# Patient Record
Sex: Female | Born: 1955 | Hispanic: No | Smoking: Never smoker
Health system: Southern US, Community
[De-identification: ages and names within clinical notes are randomized; demographics above are authoritative.]

## PROBLEM LIST (undated history)

## (undated) HISTORY — PX: KNEE SURGERY: SHX244

---

## 1998-09-25 ENCOUNTER — Ambulatory Visit (HOSPITAL_BASED_OUTPATIENT_CLINIC_OR_DEPARTMENT_OTHER): Admission: RE | Admit: 1998-09-25 | Discharge: 1998-09-25 | Payer: Self-pay | Admitting: Orthopedic Surgery

## 2015-06-26 ENCOUNTER — Encounter (HOSPITAL_COMMUNITY): Payer: Self-pay

## 2015-06-26 ENCOUNTER — Emergency Department (HOSPITAL_COMMUNITY)
Admission: EM | Admit: 2015-06-26 | Discharge: 2015-06-26 | Disposition: A | Discharge status: Home / Self Care | Payer: No Typology Code available for payment source | Attending: Emergency Medicine | Admitting: Emergency Medicine

## 2015-06-26 ENCOUNTER — Emergency Department (HOSPITAL_COMMUNITY): Payer: No Typology Code available for payment source

## 2015-06-26 DIAGNOSIS — S199XXA Unspecified injury of neck, initial encounter: Secondary | ICD-10-CM | POA: Insufficient documentation

## 2015-06-26 DIAGNOSIS — Y998 Other external cause status: Secondary | ICD-10-CM | POA: Insufficient documentation

## 2015-06-26 DIAGNOSIS — Y9389 Activity, other specified: Secondary | ICD-10-CM | POA: Diagnosis not present

## 2015-06-26 DIAGNOSIS — Y92410 Unspecified street and highway as the place of occurrence of the external cause: Secondary | ICD-10-CM | POA: Diagnosis not present

## 2015-06-26 DIAGNOSIS — S20212A Contusion of left front wall of thorax, initial encounter: Secondary | ICD-10-CM

## 2015-06-26 DIAGNOSIS — S29001A Unspecified injury of muscle and tendon of front wall of thorax, initial encounter: Secondary | ICD-10-CM | POA: Diagnosis present

## 2015-06-26 MED ORDER — OXYCODONE-ACETAMINOPHEN 5-325 MG PO TABS
1.0000 | ORAL_TABLET | Freq: Once | ORAL | Status: AC
  Administered 2015-06-26: 1 via ORAL
  Filled 2015-06-26: qty 1

## 2015-06-26 MED ORDER — OXYCODONE-ACETAMINOPHEN 5-325 MG PO TABS: 2.0000 | ORAL_TABLET | Freq: Once | ORAL | Status: DC

## 2015-06-26 MED ORDER — OXYCODONE-ACETAMINOPHEN 5-325 MG PO TABS: 1.0000 | ORAL_TABLET | ORAL | Status: AC | PRN

## 2015-06-26 MED ORDER — OXYCODONE-ACETAMINOPHEN 5-325 MG PO TABS
1.0000 | ORAL_TABLET | ORAL | Status: DC | PRN
  Administered 2015-06-26: 1 via ORAL
  Filled 2015-06-26: qty 1
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
## 2022-10-18 ENCOUNTER — Emergency Department (HOSPITAL_COMMUNITY)
Admission: EM | Admit: 2022-10-18 | Discharge: 2022-10-18 | Disposition: A | Discharge status: Home / Self Care | Payer: Self-pay | Attending: Student | Admitting: Student

## 2022-10-18 ENCOUNTER — Emergency Department (HOSPITAL_COMMUNITY): Payer: Self-pay

## 2022-10-18 ENCOUNTER — Encounter (HOSPITAL_COMMUNITY): Payer: Self-pay

## 2022-10-18 ENCOUNTER — Other Ambulatory Visit: Payer: Self-pay

## 2022-10-18 DIAGNOSIS — R1031 Right lower quadrant pain: Secondary | ICD-10-CM | POA: Insufficient documentation

## 2022-10-18 DIAGNOSIS — X58XXXA Exposure to other specified factors, initial encounter: Secondary | ICD-10-CM | POA: Insufficient documentation

## 2022-10-18 DIAGNOSIS — T148XXA Other injury of unspecified body region, initial encounter: Secondary | ICD-10-CM | POA: Insufficient documentation

## 2022-10-18 LAB — URINALYSIS, ROUTINE W REFLEX MICROSCOPIC
Bacteria, UA: NONE SEEN
Bilirubin Urine: NEGATIVE
Glucose, UA: NEGATIVE mg/dL
Ketones, ur: NEGATIVE mg/dL
Leukocytes,Ua: NEGATIVE
Nitrite: NEGATIVE
Protein, ur: NEGATIVE mg/dL
Specific Gravity, Urine: 1.016 (ref 1.005–1.030)
pH: 5 (ref 5.0–8.0)

## 2022-10-18 LAB — COMPREHENSIVE METABOLIC PANEL
ALT: 23 U/L (ref 0–44)
AST: 20 U/L (ref 15–41)
Albumin: 3.9 g/dL (ref 3.5–5.0)
Alkaline Phosphatase: 57 U/L (ref 38–126)
Anion gap: 8 (ref 5–15)
BUN: 19 mg/dL (ref 8–23)
CO2: 25 mmol/L (ref 22–32)
Calcium: 8.9 mg/dL (ref 8.9–10.3)
Chloride: 106 mmol/L (ref 98–111)
Creatinine, Ser: 0.65 mg/dL (ref 0.44–1.00)
GFR, Estimated: >60 mL/min (ref >60)
Glucose, Bld: 94 mg/dL (ref 70–99)
Potassium: 3.9 mmol/L (ref 3.5–5.1)
Sodium: 139 mmol/L (ref 135–145)
Total Bilirubin: 0.7 mg/dL (ref 0.3–1.2)
Total Protein: 6.9 g/dL (ref 6.5–8.1)

## 2022-10-18 LAB — CBC
HCT: 43.7 % (ref 36.0–46.0)
Hemoglobin: 14.5 g/dL (ref 12.0–15.0)
MCH: 29.1 pg (ref 26.0–34.0)
MCHC: 33.2 g/dL (ref 30.0–36.0)
MCV: 87.6 fL (ref 80.0–100.0)
Platelets: 169 10*3/uL (ref 150–400)
RBC: 4.99 MIL/uL (ref 3.87–5.11)
RDW: 13.2 % (ref 11.5–15.5)
WBC: 5.2 10*3/uL (ref 4.0–10.5)
nRBC: 0 % (ref 0.0–0.2)

## 2022-10-18 LAB — LIPASE, BLOOD: Lipase: 28 U/L (ref 11–51)

## 2022-10-18 MED ORDER — KETOROLAC TROMETHAMINE 15 MG/ML IJ SOLN
15.0000 mg | Freq: Once | INTRAMUSCULAR | Status: AC
  Administered 2022-10-18: 15 mg via INTRAVENOUS
  Filled 2022-10-18: qty 1

## 2022-10-18 MED ORDER — KETOROLAC TROMETHAMINE 10 MG PO TABS: 10.0000 mg | ORAL_TABLET | Freq: Four times a day (QID) | ORAL | 0 refills | Status: AC | PRN

## 2022-10-18 MED ORDER — IOHEXOL 350 MG/ML SOLN
75.0000 mL | Freq: Once | INTRAVENOUS | Status: AC | PRN
  Administered 2022-10-18: 75 mL via INTRAVENOUS

## 2022-10-18 NOTE — ED Triage Notes (Signed)
Pt arrived POV from home c/o right lower abdominal pain x1 week. Pt was seen at an urgent care last Sunday and given medicine for muscle inflammation but she still has the pain. Pt denies any N/V/D.

## 2022-10-18 NOTE — ED Provider Notes (Signed)
[place] [facility]MENT AT [facility] Provider Note   CSN: [id number] Arrival date & time: 10/18/22  1112     History  Chief Complaint  Patient presents with   Abdominal Pain    Jessica Levy is a 67 y.o. female, pertinent past medical history, who presents to the ED secondary to right lower quadrant abdominal pain has been going on for the past week.  She states that it has been random in nature, and has some painful urination, as well as stabbing pain to the right lower quadrant that is random in nature.  Notes that it does come and go, when as moving around, nothing makes it better.  She said she went to urgent care last week, and was told it was likely a muscle strain, however she has been using the medications they gave her w/o any relief.  Denies any vaginal discharge, your frequency, urgency, vomiting or diarrhea    Home Medications Prior to Admission medications   Medication Sig Start Date End Date Taking? Authorizing Provider  ketorolac (TORADOL) 10 MG tablet Take 1 tablet (10 mg total) by mouth every 6 (six) hours as needed. 10/18/22  Yes Daivon Rayos L, PA  oxyCODONE-acetaminophen (PERCOCET) 5-325 MG tablet Take 1 tablet by mouth every 4 (four) hours as needed for severe pain. 06/26/15   Mancel Bale, MD      Allergies    Patient has no known allergies.    Review of Systems   Review of Systems  Gastrointestinal:  Positive for abdominal pain and nausea. Negative for diarrhea.    Physical Exam Updated Vital Signs BP 129/80   Pulse 64   Temp 98.6 F (37 C)   Resp 18   Ht 4' 9.09" (1.45 m)   Wt 81.6 kg   SpO2 98%   BMI 38.83 kg/m  Physical Exam Vitals and nursing note reviewed.  Constitutional:      General: She is not in acute distress.    Appearance: She is well-developed.  HENT:     Head: Normocephalic and atraumatic.  Eyes:     Conjunctiva/sclera: Conjunctivae normal.  Cardiovascular:     Rate and Rhythm: Normal rate and regular  rhythm.     Heart sounds: No murmur heard. Pulmonary:     Effort: Pulmonary effort is normal. No respiratory distress.     Breath sounds: Normal breath sounds.  Abdominal:     Palpations: Abdomen is soft.     Tenderness: There is abdominal tenderness in the right lower quadrant.  Musculoskeletal:        General: No swelling.     Cervical back: Neck supple.  Skin:    General: Skin is warm and dry.     Capillary Refill: Capillary refill takes less than 2 seconds.  Neurological:     Mental Status: She is alert.  Psychiatric:        Mood and Affect: Mood normal.     ED Results / Procedures / Treatments   Labs (all labs ordered are listed, but only abnormal results are displayed) Labs Reviewed  URINALYSIS, ROUTINE W REFLEX MICROSCOPIC - Abnormal; Notable for the following components:      Result Value   Hgb urine dipstick Judye Lorino (*)    All other components within normal limits  LIPASE, BLOOD  COMPREHENSIVE METABOLIC PANEL  CBC    EKG None  Radiology CT ABDOMEN PELVIS W CONTRAST  Result Date: 10/18/2022 CLINICAL DATA:  Right lower quadrant abdominal pain. EXAM: CT  ABDOMEN AND PELVIS WITH CONTRAST TECHNIQUE: Multidetector CT imaging of the abdomen and pelvis was performed using the standard protocol following bolus administration of intravenous contrast. RADIATION DOSE REDUCTION: This exam was performed according to the departmental dose-optimization program which includes automated exposure control, adjustment of the mA and/or kV according to patient size and/or use of iterative reconstruction technique. CONTRAST:  75mL OMNIPAQUE IOHEXOL 350 MG/ML SOLN COMPARISON:  None FINDINGS: Lower chest: Scarring or atelectasis in the medial right lower lobe adjacent to spinal osteophytes. Otherwise, the lung bases are clear. Hepatobiliary: Study has significant limitations from motion artifact. No gross abnormality to the liver or gallbladder. Pancreas: Unremarkable. No pancreatic ductal  dilatation or surrounding inflammatory changes. Spleen: Normal in size without focal abnormality. Adrenals/Urinary Tract: . Limited evaluation of the adrenal glands particularly the right due to the motion artifact. No gross abnormality to the kidneys. No hydronephrosis. Normal appearance of the urinary bladder. Stomach/Bowel: Normal appendix in the right lower quadrant without inflammatory changes. Normal appearance of the stomach. No evidence for bowel dilatation or bowel inflammation. Vascular/Lymphatic: Atherosclerotic disease in the abdominal aorta without aneurysm. No lymph node enlargement in the abdomen or pelvis. Reproductive: Uterus is unremarkable. Low-density structures involving the bilateral adnexa or ovaries. These could represent cysts but indeterminate in a patient of this age. No inflammatory changes around the adnexa. Other: Negative for free fluid or free air. Musculoskeletal: No acute bone abnormality. IMPRESSION: 1. No acute abnormality in the abdomen or pelvis. Normal appendix. 2. Limited examination due to significant motion artifact. 3. Low-density structures involving the bilateral adnexa or ovaries. These could represent simple cysts but indeterminate in a patient of this age. Recommend further characterization with a non emergent pelvic ultrasound. 4. Aortic Atherosclerosis (ICD10-I70.0). Electronically Signed   By: Richarda Overlie M.D.   On: 10/18/2022 13:14    Procedures Procedures    Medications Ordered in ED Medications  ketorolac (TORADOL) 15 MG/ML injection 15 mg (15 mg Intravenous Given 10/18/22 1231)  iohexol (OMNIPAQUE) 350 MG/ML injection 75 mL (75 mLs Intravenous Contrast Given 10/18/22 1247)    ED Course/ Medical Decision Making/ A&P                             Medical Decision Making Is a 67 year old female, has had right lower quadrant pain for the last week, worse with movement.  She was told it was likely secondary to muscle strain, but the pain has continued.   She is here for further evaluation.  She has right lower quadrant abdominal pain, exam without any guarding.  However this is her second hospital visit, we will obtain a CAT scan for further evaluation as well as as well as blood work.  Toradol for pain control she is in no acute distress at this time.  Is well-appearing  Amount and/or Complexity of Data Reviewed Labs: ordered.    Details: No acute findings on labs Radiology: ordered.    Details: CT abdomen pelvis shows no acute intra-abdominal abnormalities, however she does have ovarian masses/cyst on her ovaries.  It was recommended that she have an ultrasound for follow-up. Discussion of management or test interpretation with external provider(s): Discussed with patient, she is feeling better after the Toradol, this has been going on for the past week, she has no intra-abdominal abnormalities, other than her ovarian cyst/masses.  I recommend that she follow-up with her primary care doctor, for a transvaginal ultrasound outpatient to rule out cancer, versus  cyst.  I have low suspicion that her ovaries are torsed, as she does not appear to be in any acute distress, and this has been ongoing for the past week, and the pain is worse with movement of the abdomen, bending, which aligns with a muscle strain.  Additionally there is no significant size listed on the CT scan, to suggest that 1 ovary was very much larger than the other.  Risk Prescription drug management.    Final Clinical Impression(s) / ED Diagnoses Final diagnoses:  Muscle strain  RLQ abdominal pain    Rx / DC Orders ED Discharge Orders          Ordered    ketorolac (TORADOL) 10 MG tablet  Every 6 hours PRN        10/18/22 1352              Suhana Wilner, Harley Alto, PA 10/18/22 1420    Glendora Score, MD 10/18/22 1857

## 2022-10-18 NOTE — Discharge Instructions (Addendum)
Your CT scan was reassuring, however you do have some lesions on your ovaries, that may be cyst, you will need to have a pelvic ultrasound performed outpatient for further evaluation.  Additionally if you have sudden severe pain, please return to the ER.  We that you likely have a muscle strain, please take the cyclobenzaprine at home that you have, as well as take the Toradol that I prescribed today for your pain.  Do not use ibuprofen or other anti-inflammatories with the Toradol.  Return to the ER if you have intractable pain, nausea, vomiting, or fever
